# Patient Record
Sex: Male | Born: 1965 | Race: White | Hispanic: No | Marital: Married | Smoking: Never smoker
Health system: Southern US, Community
[De-identification: ages and names within clinical notes are randomized; demographics above are authoritative.]

## PROBLEM LIST (undated history)

## (undated) DIAGNOSIS — I10 Essential (primary) hypertension: Secondary | ICD-10-CM

## (undated) HISTORY — PX: WISDOM TOOTH EXTRACTION: SHX21

## (undated) HISTORY — PX: EYE SURGERY: SHX253

---

## 2020-10-11 ENCOUNTER — Other Ambulatory Visit: Payer: Self-pay

## 2020-10-11 ENCOUNTER — Emergency Department (HOSPITAL_BASED_OUTPATIENT_CLINIC_OR_DEPARTMENT_OTHER)
Admission: EM | Admit: 2020-10-11 | Discharge: 2020-10-12 | Disposition: A | Discharge status: Home / Self Care | Attending: Emergency Medicine | Admitting: Emergency Medicine

## 2020-10-11 ENCOUNTER — Encounter (HOSPITAL_BASED_OUTPATIENT_CLINIC_OR_DEPARTMENT_OTHER): Payer: Self-pay | Admitting: *Deleted

## 2020-10-11 ENCOUNTER — Emergency Department (HOSPITAL_BASED_OUTPATIENT_CLINIC_OR_DEPARTMENT_OTHER)

## 2020-10-11 DIAGNOSIS — R000 Tachycardia, unspecified: Secondary | ICD-10-CM | POA: Insufficient documentation

## 2020-10-11 DIAGNOSIS — R0789 Other chest pain: Secondary | ICD-10-CM | POA: Insufficient documentation

## 2020-10-11 DIAGNOSIS — Z20822 Contact with and (suspected) exposure to covid-19: Secondary | ICD-10-CM | POA: Insufficient documentation

## 2020-10-11 DIAGNOSIS — R0602 Shortness of breath: Secondary | ICD-10-CM | POA: Diagnosis not present

## 2020-10-11 DIAGNOSIS — R002 Palpitations: Secondary | ICD-10-CM | POA: Diagnosis not present

## 2020-10-11 DIAGNOSIS — I10 Essential (primary) hypertension: Secondary | ICD-10-CM | POA: Diagnosis not present

## 2020-10-11 HISTORY — DX: Essential (primary) hypertension: I10

## 2020-10-11 LAB — COMPREHENSIVE METABOLIC PANEL
ALT: 65 U/L — ABNORMAL HIGH (ref 0–44)
AST: 28 U/L (ref 15–41)
Albumin: 4.7 g/dL (ref 3.5–5.0)
Alkaline Phosphatase: 80 U/L (ref 38–126)
Anion gap: 11 (ref 5–15)
BUN: 14 mg/dL (ref 6–20)
CO2: 24 mmol/L (ref 22–32)
Calcium: 9.2 mg/dL (ref 8.9–10.3)
Chloride: 100 mmol/L (ref 98–111)
Creatinine, Ser: 0.78 mg/dL (ref 0.61–1.24)
GFR, Estimated: >60 mL/min (ref >60)
Glucose, Bld: 92 mg/dL (ref 70–99)
Potassium: 3.6 mmol/L (ref 3.5–5.1)
Sodium: 135 mmol/L (ref 135–145)
Total Bilirubin: 0.4 mg/dL (ref 0.3–1.2)
Total Protein: 8.2 g/dL — ABNORMAL HIGH (ref 6.5–8.1)

## 2020-10-11 LAB — CBC WITH DIFFERENTIAL/PLATELET
Abs Immature Granulocytes: 0.08 10*3/uL — ABNORMAL HIGH (ref 0.00–0.07)
Basophils Absolute: 0.1 10*3/uL (ref 0.0–0.1)
Basophils Relative: 1 %
Eosinophils Absolute: 0.2 10*3/uL (ref 0.0–0.5)
Eosinophils Relative: 3 %
HCT: 45.4 % (ref 39.0–52.0)
Hemoglobin: 15.6 g/dL (ref 13.0–17.0)
Immature Granulocytes: 1 %
Lymphocytes Relative: 31 %
Lymphs Abs: 2.5 10*3/uL (ref 0.7–4.0)
MCH: 29.6 pg (ref 26.0–34.0)
MCHC: 34.4 g/dL (ref 30.0–36.0)
MCV: 86.1 fL (ref 80.0–100.0)
Monocytes Absolute: 0.8 10*3/uL (ref 0.1–1.0)
Monocytes Relative: 10 %
Neutro Abs: 4.3 10*3/uL (ref 1.7–7.7)
Neutrophils Relative %: 54 %
Platelets: 308 10*3/uL (ref 150–400)
RBC: 5.27 MIL/uL (ref 4.22–5.81)
RDW: 12.7 % (ref 11.5–15.5)
WBC: 7.9 10*3/uL (ref 4.0–10.5)
nRBC: 0 % (ref 0.0–0.2)

## 2020-10-11 LAB — D-DIMER, QUANTITATIVE: D-Dimer, Quant: 0.42 ug/mL-FEU (ref 0.00–0.50)

## 2020-10-11 LAB — TROPONIN I (HIGH SENSITIVITY)
Troponin I (High Sensitivity): 5 ng/L (ref <18)
Troponin I (High Sensitivity): 4 ng/L (ref <18)

## 2020-10-11 MED ORDER — IOHEXOL 350 MG/ML SOLN
100.0000 mL | Freq: Once | INTRAVENOUS | Status: AC | PRN
  Administered 2020-10-12: 100 mL via INTRAVENOUS

## 2020-10-11 MED ORDER — SODIUM CHLORIDE 0.9 % IV BOLUS (SEPSIS)
1000.0000 mL | Freq: Once | INTRAVENOUS | Status: AC
  Administered 2020-10-11: 1000 mL via INTRAVENOUS

## 2020-10-11 MED ORDER — SODIUM CHLORIDE 0.9 % IV BOLUS (SEPSIS)
1000.0000 mL | Freq: Once | INTRAVENOUS | Status: AC
  Administered 2020-10-12: 1000 mL via INTRAVENOUS

## 2020-10-11 MED ORDER — SODIUM CHLORIDE 0.9 % IV SOLN: 1000.0000 mL | INTRAVENOUS | Status: DC

## 2020-10-11 NOTE — ED Triage Notes (Signed)
C/o palpations and dizziness, x 4 hrs , denies SOB , reports HR 150

## 2020-10-12 LAB — SARS CORONAVIRUS 2 (TAT 6-24 HRS): SARS Coronavirus 2: NEGATIVE

## 2020-10-12 NOTE — ED Provider Notes (Signed)
[facility] EMERGENCY DEPARTMENT Provider Note   CSN: [id number] Arrival date & time: 10/11/20  1657     History Chief Complaint  Patient presents with  . Palpitations    Oscar Diaz is a 55 y.o. male.  The history is provided by the patient. No language interpreter was used.  Palpitations Palpitations quality:  Fast Onset quality:  Sudden Timing:  Constant Progression:  Worsening Chronicity:  New Relieved by:  Nothing Worsened by:  Nothing Ineffective treatments:  None tried Associated symptoms: chest pain and shortness of breath   Associated symptoms: no nausea    Pt reports about 4 hours before he came in he noticed his heart rate was 120.  Pt reports his rate is normally around 70-80.  Pt reports he has a tightness in his chest and slight shortness of breath     Past Medical History:  Diagnosis Date  . Hypertension     There are no problems to display for this patient.   Past Surgical History:  Procedure Laterality Date  . EYE SURGERY         No family history on file.  Social History   Tobacco Use  . Smoking status: Never Smoker  . Smokeless tobacco: Never Used  Substance Use Topics  . Alcohol use: Yes    Comment: soc  . Drug use: Not Currently    Home Medications Prior to Admission medications   Not on File    Allergies    Aspirin  Review of Systems   Review of Systems  Respiratory: Positive for shortness of breath.   Cardiovascular: Positive for chest pain and palpitations.  Gastrointestinal: Negative for nausea.  All other systems reviewed and are negative.   Physical Exam Updated Vital Signs BP (!) 140/95   Pulse (!) 127   Temp 98.3 F (36.8 C) (Oral)   Resp 11   Ht 6' (1.829 m)   Wt (!) 145.2 kg   SpO2 99%   BMI 43.40 kg/m   Physical Exam Vitals and nursing note reviewed.  Constitutional:      Appearance: He is well-developed and well-nourished.  HENT:     Head: Normocephalic and atraumatic.   Eyes:     Conjunctiva/sclera: Conjunctivae normal.  Cardiovascular:     Rate and Rhythm: Normal rate and regular rhythm.     Heart sounds: No murmur heard.   Pulmonary:     Effort: Pulmonary effort is normal. No respiratory distress.     Breath sounds: Normal breath sounds.  Abdominal:     Palpations: Abdomen is soft.     Tenderness: There is no abdominal tenderness.  Musculoskeletal:        General: No edema. Normal range of motion.     Cervical back: Neck supple.  Skin:    General: Skin is warm and dry.  Neurological:     General: No focal deficit present.     Mental Status: He is alert and oriented to person, place, and time.  Psychiatric:        Mood and Affect: Mood and affect and mood normal.     ED Results / Procedures / Treatments   Labs (all labs ordered are listed, but only abnormal results are displayed) Labs Reviewed  CBC WITH DIFFERENTIAL/PLATELET - Abnormal; Notable for the following components:      Result Value   Abs Immature Granulocytes 0.08 (*)    All other components within normal limits  COMPREHENSIVE METABOLIC PANEL - Abnormal; Notable for  the following components:   Total Protein 8.2 (*)    ALT 65 (*)    All other components within normal limits  SARS CORONAVIRUS 2 (TAT 6-24 HRS)  D-DIMER, QUANTITATIVE (NOT AT [facility])  TROPONIN I (HIGH SENSITIVITY)  TROPONIN I (HIGH SENSITIVITY)  TROPONIN I (HIGH SENSITIVITY)    EKG EKG Interpretation  Date/Time:  Thursday October 11 2020 16:58:26 EST Ventricular Rate:  128 PR Interval:  150 QRS Duration: 92 QT Interval:  296 QTC Calculation: 432 R Axis:   98 Text Interpretation: Sinus tachycardia Rightward axis Cannot rule out Anterior infarct , age undetermined Abnormal ECG No STEMI Confirmed by Alvester Chou [phone]) on 10/11/2020 10:39:51 PM   Radiology DG Chest Portable 1 View  Result Date: 10/11/2020 CLINICAL DATA:  Palpitations and dizziness for 4 hours. EXAM: PORTABLE CHEST 1 VIEW COMPARISON:   None. FINDINGS: The heart size and mediastinal contours are within normal limits. Both lungs are clear. The visualized skeletal structures are unremarkable. IMPRESSION: No active disease. Electronically Signed   By: Burman Nieves M.D.   On: 10/11/2020 23:14    Procedures Procedures   Medications Ordered in ED Medications  sodium chloride 0.9 % bolus 1,000 mL (1,000 mLs Intravenous New Bag/Given 10/11/20 2336)    Followed by  sodium chloride 0.9 % bolus 1,000 mL (has no administration in time range)  iohexol (OMNIPAQUE) 350 MG/ML injection 100 mL (has no administration in time range)    ED Course  I have reviewed the triage vital signs and the nursing notes.  Pertinent labs & imaging results that were available during my care of the patient were reviewed by me and considered in my medical decision making (see chart for details).    MDM Rules/Calculators/A&P                          MDM:  Pt given Iv fluids x 2 liters.  Pt's care turned over to Dr. Blinda Leatherwood t 12 midnight  Ct and Iv fluid bolus pending Final Clinical Impression(s) / ED Diagnoses Final diagnoses:  Tachycardia    Rx / DC Orders ED Discharge Orders    None       Osie Cheeks 10/12/20 0011    Gilda Crease, MD 10/12/20 [phone]

## 2020-11-06 ENCOUNTER — Emergency Department (HOSPITAL_BASED_OUTPATIENT_CLINIC_OR_DEPARTMENT_OTHER)

## 2020-11-06 ENCOUNTER — Emergency Department (HOSPITAL_BASED_OUTPATIENT_CLINIC_OR_DEPARTMENT_OTHER)
Admission: EM | Admit: 2020-11-06 | Discharge: 2020-11-06 | Disposition: A | Discharge status: Home / Self Care | Attending: Emergency Medicine | Admitting: Emergency Medicine

## 2020-11-06 ENCOUNTER — Other Ambulatory Visit: Payer: Self-pay

## 2020-11-06 ENCOUNTER — Encounter (HOSPITAL_BASED_OUTPATIENT_CLINIC_OR_DEPARTMENT_OTHER): Payer: Self-pay | Admitting: Emergency Medicine

## 2020-11-06 DIAGNOSIS — I10 Essential (primary) hypertension: Secondary | ICD-10-CM | POA: Diagnosis not present

## 2020-11-06 DIAGNOSIS — R002 Palpitations: Secondary | ICD-10-CM | POA: Diagnosis present

## 2020-11-06 DIAGNOSIS — R079 Chest pain, unspecified: Secondary | ICD-10-CM | POA: Insufficient documentation

## 2020-11-06 DIAGNOSIS — R000 Tachycardia, unspecified: Secondary | ICD-10-CM | POA: Insufficient documentation

## 2020-11-06 LAB — BASIC METABOLIC PANEL
Anion gap: 11 (ref 5–15)
BUN: 13 mg/dL (ref 6–20)
CO2: 24 mmol/L (ref 22–32)
Calcium: 8.9 mg/dL (ref 8.9–10.3)
Chloride: 104 mmol/L (ref 98–111)
Creatinine, Ser: 1.07 mg/dL (ref 0.61–1.24)
GFR, Estimated: >60 mL/min (ref >60)
Glucose, Bld: 110 mg/dL — ABNORMAL HIGH (ref 70–99)
Potassium: 3.8 mmol/L (ref 3.5–5.1)
Sodium: 139 mmol/L (ref 135–145)

## 2020-11-06 LAB — TROPONIN I (HIGH SENSITIVITY): Troponin I (High Sensitivity): 6 ng/L (ref <18)

## 2020-11-06 LAB — CBC
HCT: 44.7 % (ref 39.0–52.0)
Hemoglobin: 15.4 g/dL (ref 13.0–17.0)
MCH: 29.3 pg (ref 26.0–34.0)
MCHC: 34.5 g/dL (ref 30.0–36.0)
MCV: 85 fL (ref 80.0–100.0)
Platelets: 275 10*3/uL (ref 150–400)
RBC: 5.26 MIL/uL (ref 4.22–5.81)
RDW: 12.6 % (ref 11.5–15.5)
WBC: 8 10*3/uL (ref 4.0–10.5)
nRBC: 0 % (ref 0.0–0.2)

## 2020-11-06 MED ORDER — METOPROLOL TARTRATE 25 MG PO TABS: 25.0000 mg | ORAL_TABLET | Freq: Every day | ORAL | 0 refills | Status: AC | PRN

## 2020-11-06 MED ORDER — METOPROLOL TARTRATE 5 MG/5ML IV SOLN
10.0000 mg | Freq: Once | INTRAVENOUS | Status: AC
  Administered 2020-11-06: 10 mg via INTRAVENOUS
  Filled 2020-11-06: qty 10

## 2020-11-06 MED ORDER — SODIUM CHLORIDE 0.9 % IV BOLUS
1000.0000 mL | Freq: Once | INTRAVENOUS | Status: AC
  Administered 2020-11-06: 1000 mL via INTRAVENOUS

## 2020-11-06 NOTE — Discharge Instructions (Signed)
Take metoprolol 25 mg daily as needed if your heart rate is above 120   See Dr. Mayford Knife for follow up about your Ziopatch results   Return to ER if you have worse palpitations, chest pain, trouble breathing

## 2020-11-06 NOTE — ED Provider Notes (Signed)
[facility] EMERGENCY DEPARTMENT Provider Note   CSN: [id number] Arrival date & time: 11/06/20  2114     History Chief Complaint  Patient presents with  . Palpitations  . Chest Pain    Oscar Diaz is a 55 y.o. male history of hypertension, who presented with palpitations.  Patient had palpitations about a month ago.  He had a CTA that was unremarkable and labs that were unremarkable.  Patient eventually follow-up with Dr. Mayford Knife from cardiology.  He had a Zio patch was placed and was taken off 4 days ago.  Patient states that he had no episodes during that time and he had a Zio patch.  He states that he did drink some coke today and then had sudden onset of palpitations.  He states that he has apple watch that showed that his heart rate was in the 150s.  Denies any chest pain or shortness of breath.   The history is provided by the patient.       Past Medical History:  Diagnosis Date  . Hypertension     There are no problems to display for this patient.   Past Surgical History:  Procedure Laterality Date  . EYE SURGERY    . WISDOM TOOTH EXTRACTION         No family history on file.  Social History   Tobacco Use  . Smoking status: Never Smoker  . Smokeless tobacco: Never Used  Substance Use Topics  . Alcohol use: Yes    Comment: soc  . Drug use: Not Currently    Home Medications Prior to Admission medications   Medication Sig Start Date End Date Taking? Authorizing Provider  hydrochlorothiazide (HYDRODIURIL) 50 MG tablet Take 1 tablet by mouth daily. 12/01/19  Yes [provider]    Allergies    Aspirin and Nsaids  Review of Systems   Review of Systems  Cardiovascular: Positive for chest pain and palpitations.  All other systems reviewed and are negative.   Physical Exam Updated Vital Signs BP (!) 134/113 (BP Location: Left Arm)   Pulse (!) 134   Temp 98.3 F (36.8 C) (Oral)   Resp 20   Ht 6' (1.829 m)   Wt (!) 149.7 kg    SpO2 98%   BMI 44.76 kg/m   Physical Exam Vitals and nursing note reviewed.  HENT:     Head: Normocephalic.  Eyes:     Extraocular Movements: Extraocular movements intact.     Pupils: Pupils are equal, round, and reactive to light.  Cardiovascular:     Rate and Rhythm: Regular rhythm. Tachycardia present.     Heart sounds: Normal heart sounds.  Pulmonary:     Effort: Pulmonary effort is normal.     Breath sounds: Normal breath sounds.  Abdominal:     General: Bowel sounds are normal.     Palpations: Abdomen is soft.  Musculoskeletal:        General: Normal range of motion.     Cervical back: Normal range of motion and neck supple.  Skin:    General: Skin is warm.     Capillary Refill: Capillary refill takes less than 2 seconds.  Neurological:     General: No focal deficit present.     Mental Status: He is alert and oriented to person, place, and time.  Psychiatric:        Mood and Affect: Mood normal.        Behavior: Behavior normal.  ED Results / Procedures / Treatments   Labs (all labs ordered are listed, but only abnormal results are displayed) Labs Reviewed  BASIC METABOLIC PANEL - Abnormal; Notable for the following components:      Result Value   Glucose, Bld 110 (*)    All other components within normal limits  CBC  TSH  TROPONIN I (HIGH SENSITIVITY)    EKG EKG Interpretation  Date/Time:  Tuesday November 06 2020 21:30:32 EST Ventricular Rate:  137 PR Interval:  158 QRS Duration: 86 QT Interval:  328 QTC Calculation: 495 R Axis:   99 Text Interpretation: Sinus tachycardia Rightward axis Pulmonary disease pattern Inferior infarct , age undetermined Abnormal ECG Since last tracing rate faster Confirmed by Richardean Canal [phone]) on 11/06/2020 9:49:28 PM   Radiology No results found.  Procedures Procedures   Medications Ordered in ED Medications  sodium chloride 0.9 % bolus 1,000 mL (1,000 mLs Intravenous New Bag/Given 11/06/20 2158)   metoprolol tartrate (LOPRESSOR) injection 10 mg (10 mg Intravenous Given 11/06/20 2158)    ED Course  I have reviewed the triage vital signs and the nursing notes.  Pertinent labs & imaging results that were available during my care of the patient were reviewed by me and considered in my medical decision making (see chart for details).    MDM Rules/Calculators/A&P                         Oscar Diaz is a 55 y.o. male here with palpitations.  Patient had palpitations a month ago with a heart rate around 130s.  Cardiology saw patient and ordered Zio patch with results pending.  Patient did had some caffeine earlier and I think that is what is causing his palpitations and a heart rate of 130s.He has no chest pain or shortness of breath and just had negative CTA. Will get cbc, bmp, tsh, trop x 1, CXR. Will give IV fluids and beta-blockers.  Patient adamantly denies any cocaine use.  11:26 PM HR down to 60s after metoprolol. Labs unremarkable. Will dc home with lopressor prn. Will have him follow up with cardiology regarding Ziopatch results   Final Clinical Impression(s) / ED Diagnoses Final diagnoses:  None    Rx / DC Orders ED Discharge Orders    None       Charlynne Pander, MD 11/06/20 2326

## 2020-11-06 NOTE — ED Triage Notes (Signed)
Pt reports "fast heart rate"; pt reports he was seen here two weeks ago for same symptoms; pt denies cardiac hx; pt reports dizziness, chest pressure and SHOB

## 2020-11-07 LAB — TROPONIN I (HIGH SENSITIVITY): Troponin I (High Sensitivity): 6 ng/L (ref <18)

## 2020-11-22 ENCOUNTER — Telehealth (HOSPITAL_COMMUNITY): Payer: Self-pay

## 2020-11-22 NOTE — Telephone Encounter (Signed)
Contacted pt and verified by 2 identifiers.  Informed pt. That his TSH lab was not completed during his visit on 11/06/2020.     Informed pt. To follow up with PCP or return to the ED to have the lab completed. Pt verbalized understanding and all questions answered.

## 2022-01-10 IMAGING — DX DG CHEST 2V
2 series · 2 of 2 positions shown · non-contrast
Comparison: Chest radiograph dated 10/11/2020.

CLINICAL DATA: 54-year-old male with chest pain.

EXAM:
CHEST - 2 VIEW

[chest pa]
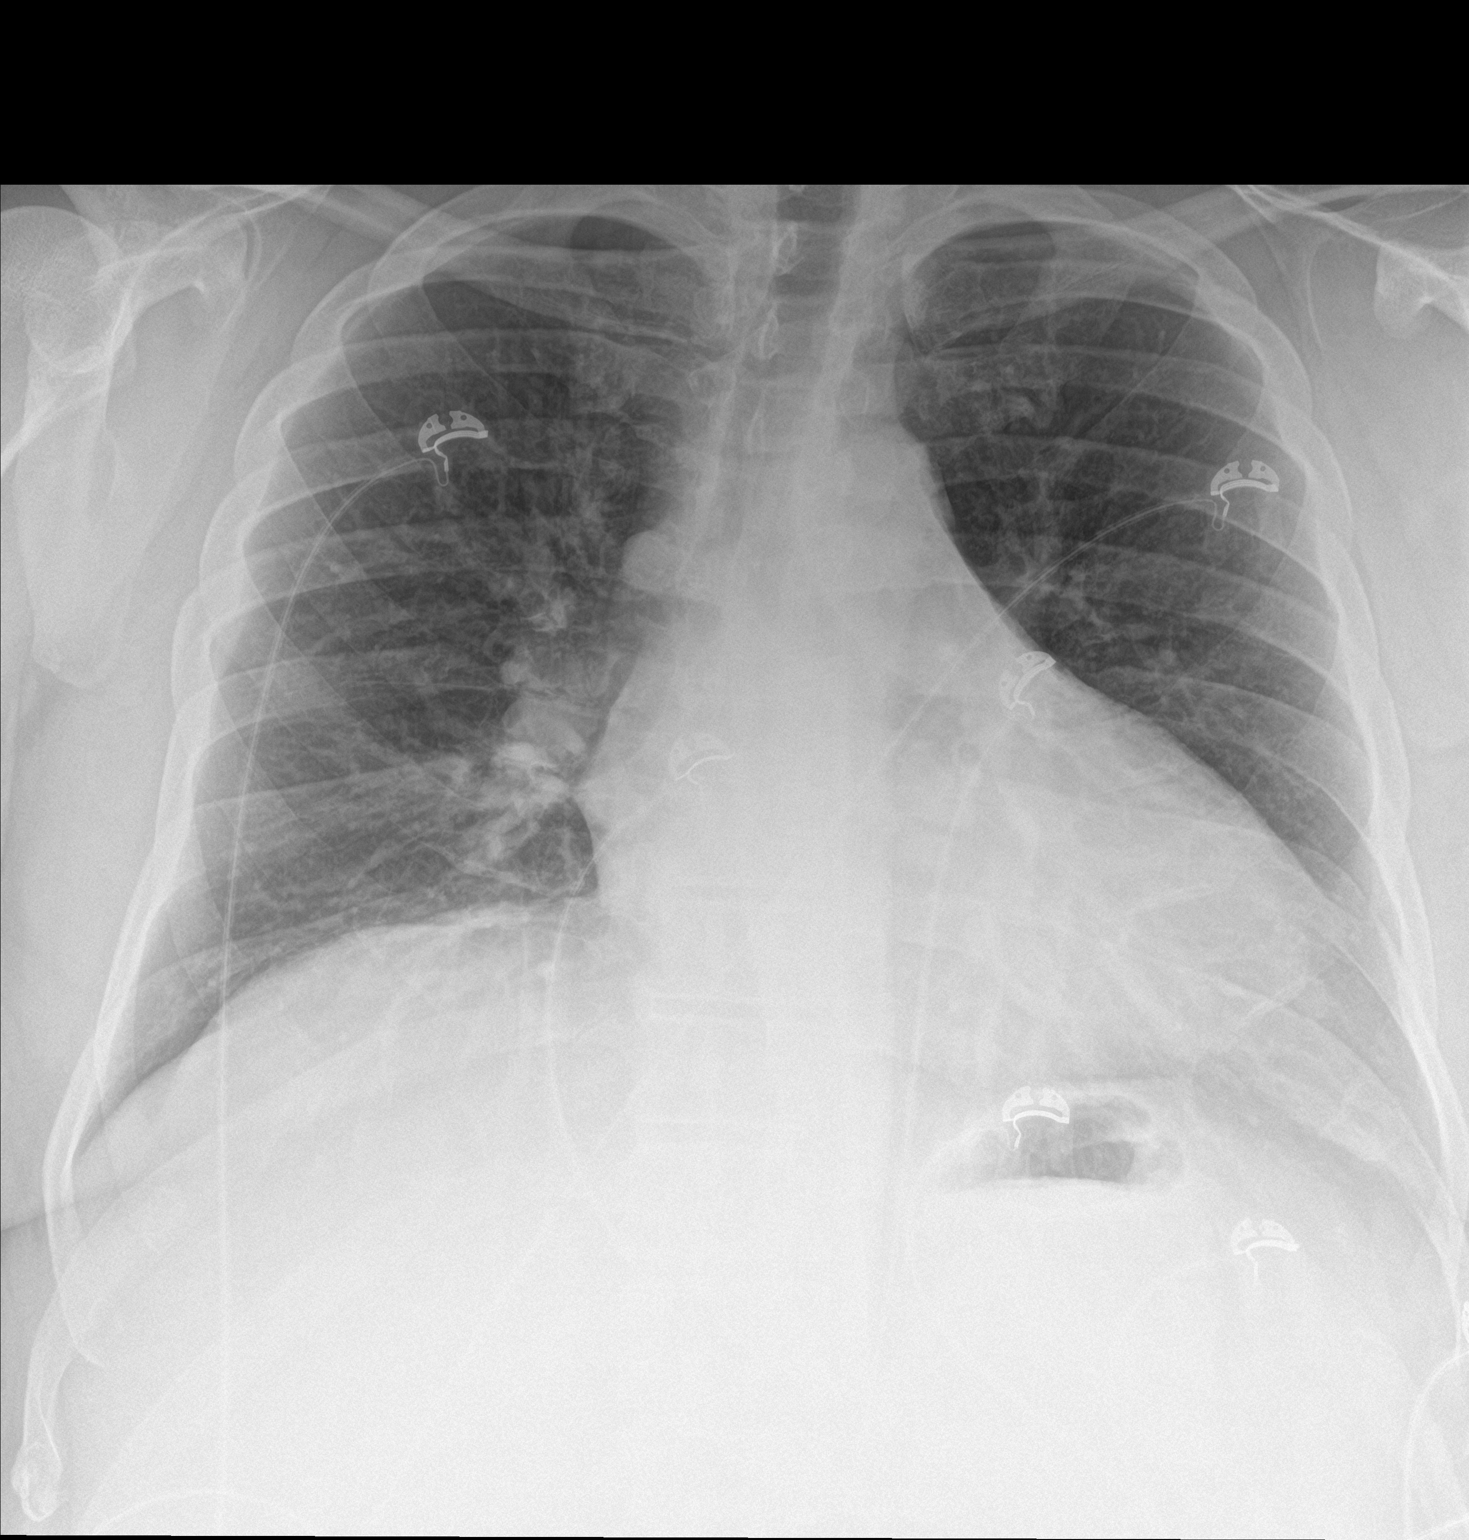

[chest lat]
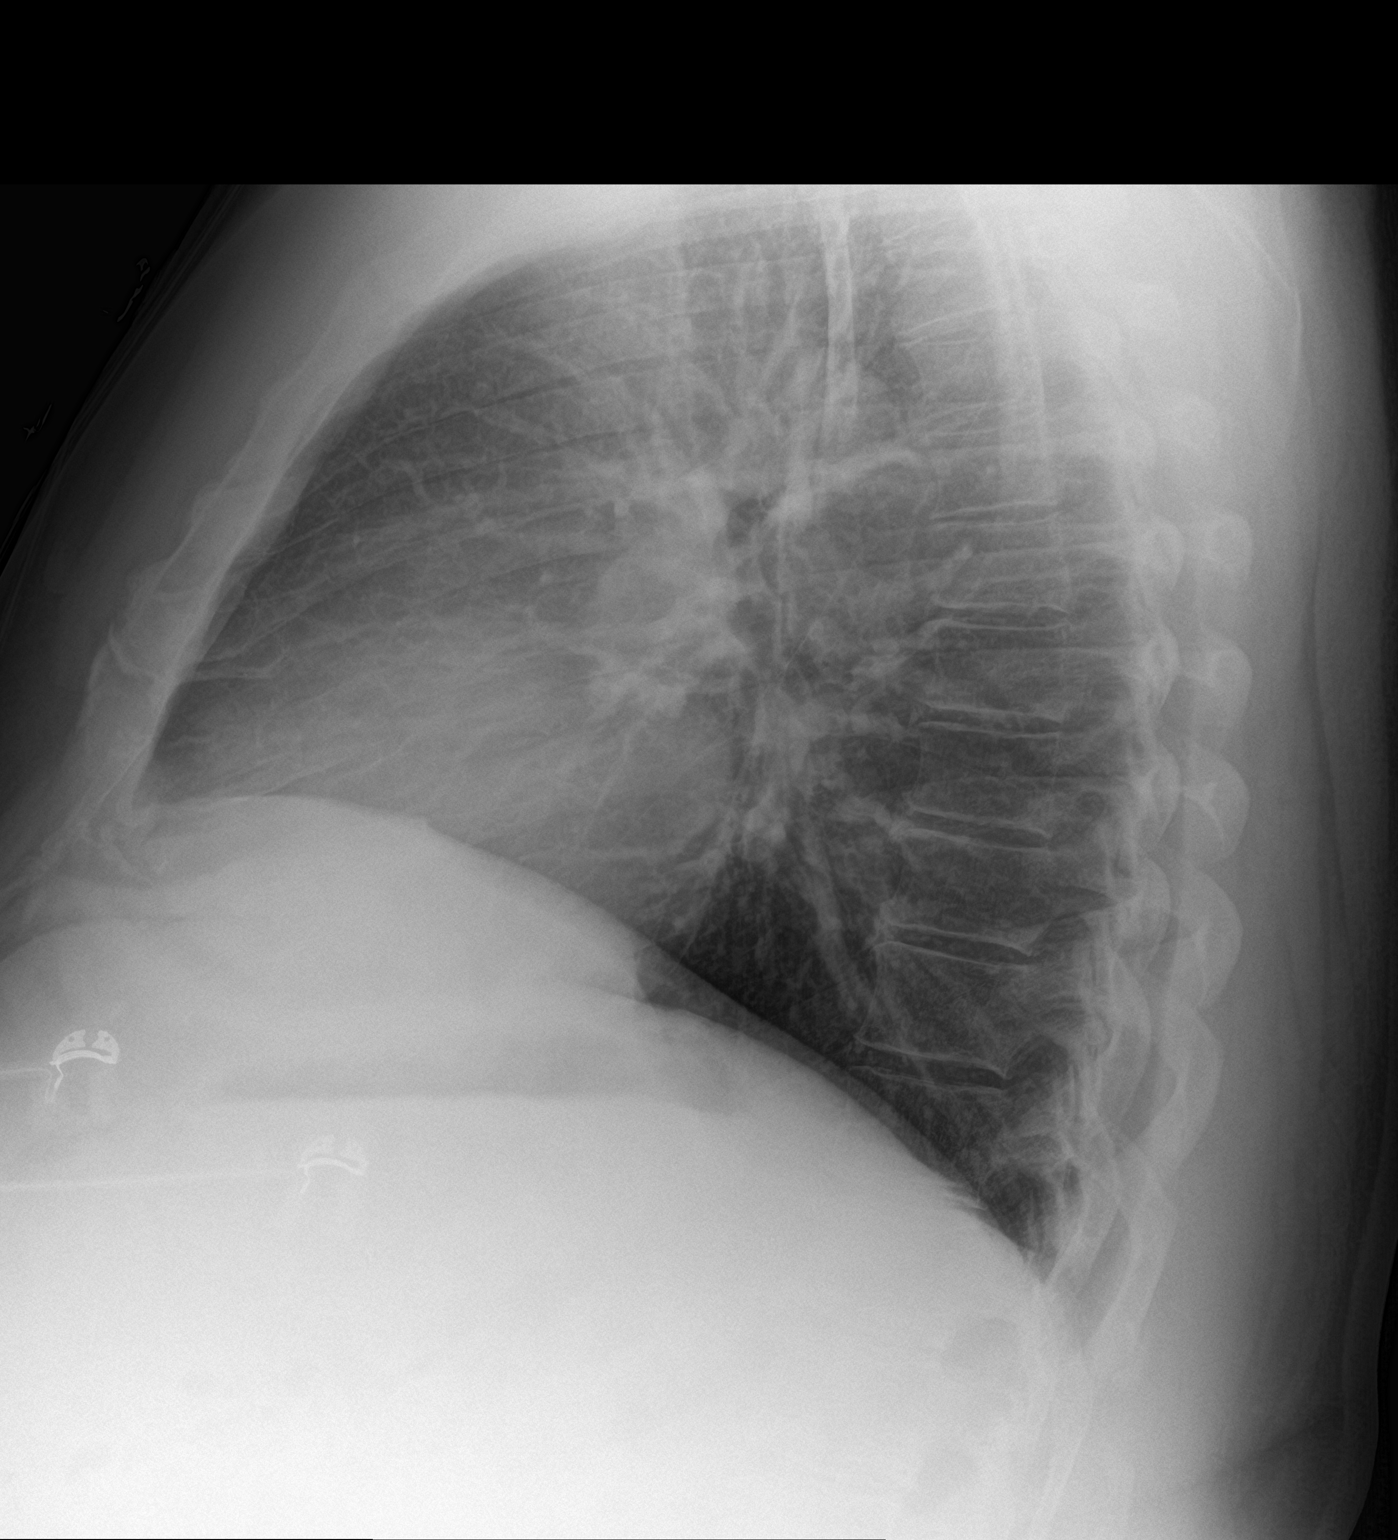

[2 of 2 positions shown; findings below may reference images not displayed]

FINDINGS: Stable mild cardiomegaly. No focal consolidation, pleural effusion,
pneumothorax. No acute osseous pathology.
IMPRESSION: No active cardiopulmonary disease.
